# Patient Record
Sex: Female | Born: 2000
Health system: Southern US, Community
[De-identification: ages and names within clinical notes are randomized; demographics above are authoritative.]

---
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
## 2019-02-07 DIAGNOSIS — F4010 Social phobia, unspecified: Secondary | ICD-10-CM | POA: Diagnosis not present

## 2019-02-07 DIAGNOSIS — Z68.41 Body mass index (BMI) pediatric, 5th percentile to less than 85th percentile for age: Secondary | ICD-10-CM | POA: Diagnosis not present

## 2019-02-07 MED FILL — VENLAFAXINE HCL ER 75 MG CA: 75 | 30 days supply | Qty: 90 | Fill #0

## 2019-02-07 MED FILL — hydrOXYzine HCL 10 MG TABS: 10 | 30 days supply | Qty: 60 | Fill #0

## 2019-03-01 MED FILL — VENLAFAXINE HCL ER 75 MG CA: 75 | 30 days supply | Qty: 90 | Fill #1

## 2019-03-30 DIAGNOSIS — F341 Dysthymic disorder: Secondary | ICD-10-CM | POA: Diagnosis not present

## 2019-03-30 DIAGNOSIS — Z6823 Body mass index (BMI) 23.0-23.9, adult: Secondary | ICD-10-CM | POA: Diagnosis not present

## 2019-04-05 MED FILL — VENLAFAXINE HCL ER 75 MG CA: 75 | 30 days supply | Qty: 90 | Fill #0

## 2019-05-09 MED FILL — VENLAFAXINE HCL ER 75 MG CA: 75 | 30 days supply | Qty: 90 | Fill #1

## 2019-05-17 DIAGNOSIS — Z20822 Contact with and (suspected) exposure to covid-19: Secondary | ICD-10-CM | POA: Diagnosis not present

## 2019-05-24 DIAGNOSIS — F418 Other specified anxiety disorders: Secondary | ICD-10-CM | POA: Diagnosis not present

## 2019-05-24 DIAGNOSIS — R41840 Attention and concentration deficit: Secondary | ICD-10-CM | POA: Diagnosis not present

## 2019-05-24 DIAGNOSIS — Z6823 Body mass index (BMI) 23.0-23.9, adult: Secondary | ICD-10-CM | POA: Diagnosis not present

## 2019-06-22 DIAGNOSIS — F418 Other specified anxiety disorders: Secondary | ICD-10-CM | POA: Diagnosis not present

## 2019-06-22 DIAGNOSIS — Z6823 Body mass index (BMI) 23.0-23.9, adult: Secondary | ICD-10-CM | POA: Diagnosis not present

## 2019-06-27 MED FILL — VENLAFAXINE HCL ER 75 MG CA: 75 | 30 days supply | Qty: 90 | Fill #0

## 2019-08-10 MED FILL — VENLAFAXINE HCL ER 75 MG CA: 75 | 90 days supply | Qty: 270 | Fill #0

## 2019-11-16 DIAGNOSIS — F418 Other specified anxiety disorders: Secondary | ICD-10-CM | POA: Diagnosis not present

## 2019-11-16 DIAGNOSIS — Z68.41 Body mass index (BMI) pediatric, 5th percentile to less than 85th percentile for age: Secondary | ICD-10-CM | POA: Diagnosis not present

## 2019-12-12 DIAGNOSIS — F418 Other specified anxiety disorders: Secondary | ICD-10-CM | POA: Diagnosis not present

## 2019-12-20 DIAGNOSIS — Z6823 Body mass index (BMI) 23.0-23.9, adult: Secondary | ICD-10-CM | POA: Diagnosis not present

## 2019-12-20 DIAGNOSIS — U071 COVID-19: Secondary | ICD-10-CM | POA: Diagnosis not present

## 2020-02-21 DIAGNOSIS — Z23 Encounter for immunization: Secondary | ICD-10-CM | POA: Diagnosis not present

## 2020-03-10 DIAGNOSIS — Z79899 Other long term (current) drug therapy: Secondary | ICD-10-CM | POA: Diagnosis not present

## 2020-03-10 DIAGNOSIS — F419 Anxiety disorder, unspecified: Secondary | ICD-10-CM | POA: Diagnosis not present

## 2020-03-10 DIAGNOSIS — F331 Major depressive disorder, recurrent, moderate: Secondary | ICD-10-CM | POA: Diagnosis not present

## 2020-03-10 DIAGNOSIS — F902 Attention-deficit hyperactivity disorder, combined type: Secondary | ICD-10-CM | POA: Diagnosis not present

## 2020-03-10 DIAGNOSIS — Z6823 Body mass index (BMI) 23.0-23.9, adult: Secondary | ICD-10-CM | POA: Diagnosis not present

## 2020-03-13 ENCOUNTER — Other Ambulatory Visit (HOSPITAL_COMMUNITY): Payer: Self-pay

## 2020-03-13 MED FILL — AMPHETAMINE SALTS 10 MG: 10 | 30 days supply | Qty: 60 | Fill #0

## 2020-03-13 MED FILL — SERTRALINE HCL 50 MG TABLET: 50 | 30 days supply | Qty: 30 | Fill #0

## 2020-04-19 MED FILL — SERTRALINE HCL 50 MG TABLET: 50 | 30 days supply | Qty: 30 | Fill #1

## 2020-04-24 ENCOUNTER — Other Ambulatory Visit (HOSPITAL_COMMUNITY): Payer: Self-pay

## 2020-04-24 DIAGNOSIS — F331 Major depressive disorder, recurrent, moderate: Secondary | ICD-10-CM | POA: Diagnosis not present

## 2020-04-24 DIAGNOSIS — Z1331 Encounter for screening for depression: Secondary | ICD-10-CM | POA: Diagnosis not present

## 2020-04-24 DIAGNOSIS — Z6824 Body mass index (BMI) 24.0-24.9, adult: Secondary | ICD-10-CM | POA: Diagnosis not present

## 2020-04-24 DIAGNOSIS — F902 Attention-deficit hyperactivity disorder, combined type: Secondary | ICD-10-CM | POA: Diagnosis not present

## 2020-04-24 MED FILL — ADDERALL XR 30 MG CAP SA: 30 | 30 days supply | Qty: 30 | Fill #0

## 2020-05-04 DIAGNOSIS — Z20822 Contact with and (suspected) exposure to covid-19: Secondary | ICD-10-CM | POA: Diagnosis not present

## 2020-05-24 ENCOUNTER — Other Ambulatory Visit (HOSPITAL_COMMUNITY): Payer: Self-pay

## 2020-05-24 DIAGNOSIS — F331 Major depressive disorder, recurrent, moderate: Secondary | ICD-10-CM | POA: Diagnosis not present

## 2020-05-24 DIAGNOSIS — F902 Attention-deficit hyperactivity disorder, combined type: Secondary | ICD-10-CM | POA: Diagnosis not present

## 2020-05-24 DIAGNOSIS — F419 Anxiety disorder, unspecified: Secondary | ICD-10-CM | POA: Diagnosis not present

## 2020-05-24 MED FILL — ADDERALL XR 30 MG CAP SA: 30 | 30 days supply | Qty: 30 | Fill #0

## 2020-05-24 MED FILL — SERTRALINE HCL 50 MG TABS: 50 | 90 days supply | Qty: 90 | Fill #0

## 2020-07-04 DIAGNOSIS — Z20822 Contact with and (suspected) exposure to covid-19: Secondary | ICD-10-CM | POA: Diagnosis not present

## 2020-07-10 DIAGNOSIS — Z20822 Contact with and (suspected) exposure to covid-19: Secondary | ICD-10-CM | POA: Diagnosis not present

## 2020-07-18 ENCOUNTER — Other Ambulatory Visit (HOSPITAL_COMMUNITY): Payer: Self-pay

## 2020-07-18 DIAGNOSIS — F419 Anxiety disorder, unspecified: Secondary | ICD-10-CM | POA: Diagnosis not present

## 2020-07-18 DIAGNOSIS — F331 Major depressive disorder, recurrent, moderate: Secondary | ICD-10-CM | POA: Diagnosis not present

## 2020-07-18 DIAGNOSIS — M549 Dorsalgia, unspecified: Secondary | ICD-10-CM | POA: Diagnosis not present

## 2020-07-18 DIAGNOSIS — F902 Attention-deficit hyperactivity disorder, combined type: Secondary | ICD-10-CM | POA: Diagnosis not present

## 2020-07-26 ENCOUNTER — Other Ambulatory Visit (HOSPITAL_BASED_OUTPATIENT_CLINIC_OR_DEPARTMENT_OTHER): Payer: Self-pay

## 2020-09-03 ENCOUNTER — Other Ambulatory Visit (HOSPITAL_COMMUNITY): Payer: Self-pay

## 2020-09-03 DIAGNOSIS — F419 Anxiety disorder, unspecified: Secondary | ICD-10-CM | POA: Diagnosis not present

## 2020-09-03 DIAGNOSIS — F902 Attention-deficit hyperactivity disorder, combined type: Secondary | ICD-10-CM | POA: Diagnosis not present

## 2020-09-03 DIAGNOSIS — F32A Depression, unspecified: Secondary | ICD-10-CM | POA: Diagnosis not present

## 2020-09-03 MED ORDER — AMPHETAMINE-DEXTROAMPHETAMINE 10 MG PO TABS
10.0000 mg | ORAL_TABLET | Freq: Every day | ORAL | 0 refills | Status: DC
  Filled 2020-09-03: qty 30, 30d supply, fill #0

## 2020-09-03 MED ORDER — AMPHETAMINE-DEXTROAMPHET ER 30 MG PO CP24
30.0000 mg | ORAL_CAPSULE | Freq: Every morning | ORAL | 0 refills | Status: DC
  Filled 2020-09-03: qty 30, 30d supply, fill #0

## 2020-09-05 ENCOUNTER — Other Ambulatory Visit (HOSPITAL_COMMUNITY): Payer: Self-pay

## 2020-09-06 ENCOUNTER — Other Ambulatory Visit (HOSPITAL_COMMUNITY): Payer: Self-pay

## 2020-09-24 ENCOUNTER — Other Ambulatory Visit (HOSPITAL_COMMUNITY): Payer: Self-pay

## 2020-09-24 MED FILL — Sertraline HCl Tab 50 MG: ORAL | 90 days supply | Qty: 90 | Fill #0 | Status: CN

## 2020-09-28 ENCOUNTER — Other Ambulatory Visit (HOSPITAL_COMMUNITY): Payer: Self-pay

## 2020-09-28 ENCOUNTER — Other Ambulatory Visit (HOSPITAL_BASED_OUTPATIENT_CLINIC_OR_DEPARTMENT_OTHER): Payer: Self-pay

## 2020-09-28 MED FILL — Sertraline HCl Tab 50 MG: ORAL | 90 days supply | Qty: 90 | Fill #0 | Status: AC

## 2020-10-03 ENCOUNTER — Other Ambulatory Visit (HOSPITAL_COMMUNITY): Payer: Self-pay

## 2020-10-19 ENCOUNTER — Other Ambulatory Visit (HOSPITAL_COMMUNITY): Payer: Self-pay

## 2020-10-19 MED ORDER — AMPHETAMINE-DEXTROAMPHET ER 30 MG PO CP24
ORAL_CAPSULE | ORAL | 0 refills | Status: AC
  Filled 2020-10-19: qty 30, 30d supply, fill #0

## 2020-10-21 ENCOUNTER — Observation Stay (HOSPITAL_COMMUNITY)
Admission: EM | Admit: 2020-10-21 | Discharge: 2020-10-22 | Disposition: A | Discharge status: Home / Self Care | Payer: 59 | Attending: General Surgery | Admitting: General Surgery

## 2020-10-21 ENCOUNTER — Other Ambulatory Visit: Payer: Self-pay

## 2020-10-21 ENCOUNTER — Encounter (HOSPITAL_COMMUNITY): Payer: Self-pay | Admitting: *Deleted

## 2020-10-21 DIAGNOSIS — R7401 Elevation of levels of liver transaminase levels: Secondary | ICD-10-CM | POA: Diagnosis present

## 2020-10-21 DIAGNOSIS — R1011 Right upper quadrant pain: Secondary | ICD-10-CM | POA: Diagnosis not present

## 2020-10-21 DIAGNOSIS — M549 Dorsalgia, unspecified: Secondary | ICD-10-CM | POA: Diagnosis not present

## 2020-10-21 DIAGNOSIS — R52 Pain, unspecified: Secondary | ICD-10-CM

## 2020-10-21 DIAGNOSIS — R8290 Unspecified abnormal findings in urine: Secondary | ICD-10-CM | POA: Diagnosis not present

## 2020-10-21 DIAGNOSIS — R112 Nausea with vomiting, unspecified: Secondary | ICD-10-CM

## 2020-10-21 DIAGNOSIS — K8010 Calculus of gallbladder with chronic cholecystitis without obstruction: Secondary | ICD-10-CM | POA: Diagnosis not present

## 2020-10-21 DIAGNOSIS — Z20822 Contact with and (suspected) exposure to covid-19: Secondary | ICD-10-CM | POA: Insufficient documentation

## 2020-10-21 DIAGNOSIS — R9431 Abnormal electrocardiogram [ECG] [EKG]: Secondary | ICD-10-CM | POA: Diagnosis not present

## 2020-10-21 DIAGNOSIS — K8020 Calculus of gallbladder without cholecystitis without obstruction: Secondary | ICD-10-CM | POA: Diagnosis not present

## 2020-10-21 DIAGNOSIS — R1013 Epigastric pain: Secondary | ICD-10-CM | POA: Diagnosis not present

## 2020-10-21 LAB — COMPREHENSIVE METABOLIC PANEL
ALT: 46 U/L — ABNORMAL HIGH (ref 0–44)
AST: 77 U/L — ABNORMAL HIGH (ref 15–41)
Albumin: 5.2 g/dL — ABNORMAL HIGH (ref 3.5–5.0)
Alkaline Phosphatase: 71 U/L (ref 38–126)
Anion gap: 13 (ref 5–15)
BUN: 11 mg/dL (ref 6–20)
CO2: 23 mmol/L (ref 22–32)
Calcium: 10.1 mg/dL (ref 8.9–10.3)
Chloride: 100 mmol/L (ref 98–111)
Creatinine, Ser: 0.64 mg/dL (ref 0.44–1.00)
GFR, Estimated: >60 mL/min (ref >60)
Glucose, Bld: 102 mg/dL — ABNORMAL HIGH (ref 70–99)
Potassium: 3.9 mmol/L (ref 3.5–5.1)
Sodium: 136 mmol/L (ref 135–145)
Total Bilirubin: 1.8 mg/dL — ABNORMAL HIGH (ref 0.3–1.2)
Total Protein: 9 g/dL — ABNORMAL HIGH (ref 6.5–8.1)

## 2020-10-21 LAB — CBC
HCT: 44.6 % (ref 36.0–46.0)
Hemoglobin: 14.5 g/dL (ref 12.0–15.0)
MCH: 27.5 pg (ref 26.0–34.0)
MCHC: 32.5 g/dL (ref 30.0–36.0)
MCV: 84.5 fL (ref 80.0–100.0)
Platelets: 253 10*3/uL (ref 150–400)
RBC: 5.28 MIL/uL — ABNORMAL HIGH (ref 3.87–5.11)
RDW: 12.9 % (ref 11.5–15.5)
WBC: 9.6 10*3/uL (ref 4.0–10.5)
nRBC: 0 % (ref 0.0–0.2)

## 2020-10-21 LAB — RESP PANEL BY RT-PCR (FLU A&B, COVID) ARPGX2
Influenza A by PCR: NEGATIVE
Influenza B by PCR: NEGATIVE
SARS Coronavirus 2 by RT PCR: NEGATIVE

## 2020-10-21 LAB — HCG, QUANTITATIVE, PREGNANCY: hCG, Beta Chain, Quant, S: <1 m[IU]/mL (ref <5)

## 2020-10-21 LAB — LIPASE, BLOOD: Lipase: 28 U/L (ref 11–51)

## 2020-10-21 MED ORDER — HYDROMORPHONE HCL 1 MG/ML IJ SOLN
1.0000 mg | INTRAMUSCULAR | Status: DC | PRN
  Administered 2020-10-21: 1 mg via INTRAVENOUS
  Filled 2020-10-21: qty 1

## 2020-10-21 MED ORDER — SODIUM CHLORIDE 0.9 % IV SOLN
12.5000 mg | Freq: Four times a day (QID) | INTRAVENOUS | Status: DC | PRN
  Administered 2020-10-22: 12.5 mg via INTRAVENOUS
  Filled 2020-10-21: qty 0.5

## 2020-10-21 MED ORDER — DIPHENHYDRAMINE HCL 50 MG/ML IJ SOLN: 25.0000 mg | Freq: Four times a day (QID) | INTRAMUSCULAR | Status: DC | PRN

## 2020-10-21 MED ORDER — LORAZEPAM 2 MG/ML IJ SOLN: 1.0000 mg | INTRAMUSCULAR | Status: DC | PRN

## 2020-10-21 MED ORDER — SODIUM CHLORIDE 0.9 % IV SOLN
2.0000 g | INTRAVENOUS | Status: DC
  Administered 2020-10-21: 2 g via INTRAVENOUS
  Filled 2020-10-21: qty 20

## 2020-10-21 MED ORDER — ONDANSETRON HCL 4 MG/2ML IJ SOLN
4.0000 mg | Freq: Once | INTRAMUSCULAR | Status: AC
  Administered 2020-10-21: 4 mg via INTRAVENOUS
  Filled 2020-10-21: qty 2

## 2020-10-21 MED ORDER — DIPHENHYDRAMINE HCL 25 MG PO CAPS: 25.0000 mg | ORAL_CAPSULE | Freq: Four times a day (QID) | ORAL | Status: DC | PRN

## 2020-10-21 MED ORDER — ONDANSETRON 4 MG PO TBDP: 4.0000 mg | ORAL_TABLET | Freq: Four times a day (QID) | ORAL | Status: DC | PRN

## 2020-10-21 MED ORDER — SODIUM CHLORIDE 0.9 % IV BOLUS
1000.0000 mL | Freq: Once | INTRAVENOUS | Status: AC
  Administered 2020-10-21: 1000 mL via INTRAVENOUS

## 2020-10-21 MED ORDER — ACETAMINOPHEN 650 MG RE SUPP: 650.0000 mg | Freq: Four times a day (QID) | RECTAL | Status: DC | PRN

## 2020-10-21 MED ORDER — ACETAMINOPHEN 325 MG PO TABS: 650.0000 mg | ORAL_TABLET | Freq: Four times a day (QID) | ORAL | Status: DC | PRN

## 2020-10-21 MED ORDER — ONDANSETRON HCL 4 MG/2ML IJ SOLN: 4.0000 mg | Freq: Four times a day (QID) | INTRAMUSCULAR | Status: DC | PRN

## 2020-10-21 MED ORDER — LACTATED RINGERS IV SOLN: INTRAVENOUS | Status: DC

## 2020-10-21 NOTE — ED Notes (Signed)
Urine at bedside. 

## 2020-10-21 NOTE — ED Provider Notes (Signed)
[facility] EMERGENCY DEPARTMENT Provider Note   CSN: [id number] Arrival date & time: 10/21/20  1831     History Chief Complaint  Patient presents with   Abdominal Pain    Lorraine Thomas is a 20 y.o. female.  HPI  Patient with no significant medical history presents to the emergency department with chief complaint of right upper quadrant pain.  Patient states pain has been going on for last 4 weeks but over the last 2 days pain has progressed and got worse.  She endorses a throbbing-like sensation in her right upper quadrant which then radiates into her back between her scapulas.  She denies associated nausea, vomiting, denies hematemesis or coffee-ground hematemesis, constipation, diarrhea, and urinary symptoms.  She states that the pain comes and goes, she does not necessarily notice it with food intake,but over the last 2 days she has been unable to tolerate p.o.  She denies fevers, chills, congestion, sore throat or cough, denies general body aches, denies recent sick contacts.  She has no significant abdominal history.  Patient denies any alleviating factors.  History reviewed. No pertinent past medical history.  Patient Active Problem List   Diagnosis Date Noted   Transaminitis 10/21/2020    History reviewed. No pertinent surgical history.   OB History   No obstetric history on file.     No family history on file.  Social History   Tobacco Use   Smoking status: Never  Substance Use Topics   Alcohol use: Never   Drug use: Never    Home Medications Prior to Admission medications   Medication Sig Start Date End Date Taking? Authorizing Provider  amphetamine-dextroamphetamine (ADDERALL XR) 30 MG 24 hr capsule TAKE 1 CAPSULE BY MOUTH EVERY MORNING (FILL 09/14/20) 07/18/20 01/14/21    amphetamine-dextroamphetamine (ADDERALL XR) 30 MG 24 hr capsule TAKE 1 CAPSULE BY MOUTH EVERY MORNING (FILL 08/17/20) 07/18/20 01/14/21    amphetamine-dextroamphetamine (ADDERALL XR) 30 MG 24  hr capsule TAKE 1 CAPSULE BY MOUTH EVERY MORNING 07/18/20 01/14/21    amphetamine-dextroamphetamine (ADDERALL XR) 30 MG 24 hr capsule TAKE 1 CAPSULE BY MOUTH IN THE MORNING - EFFECTIVE 07/20/20 05/24/20 11/20/20    amphetamine-dextroamphetamine (ADDERALL XR) 30 MG 24 hr capsule TAKE 1 CAPSULE BY MOUTH EVERY MORNING 05/24/20 11/20/20    amphetamine-dextroamphetamine (ADDERALL XR) 30 MG 24 hr capsule TAKE 1 CAPSULE BY MOUTH DAILY IN THE MORNING - EFFECTIVE 06/23/20 05/24/20 11/20/20    amphetamine-dextroamphetamine (ADDERALL XR) 30 MG 24 hr capsule TAKE 1 CAPSULE BY MOUTH ONCE A DAY IN THE MORNING 04/24/20 10/21/20    amphetamine-dextroamphetamine (ADDERALL XR) 30 MG 24 hr capsule Take 1 capsule (30 mg total) by mouth every morning. 09/03/20     amphetamine-dextroamphetamine (ADDERALL XR) 30 MG 24 hr capsule Take 1 capsule by mouth once a day (in the morning) 10/19/20     amphetamine-dextroamphetamine (ADDERALL) 10 MG tablet TAKE 1 TABLET BY MOUTH EVERY MORNING AND AT LUNCH AS DIRECTED 03/13/20 09/09/20    amphetamine-dextroamphetamine (ADDERALL) 10 MG tablet Take 1 tablet (10 mg total) by mouth daily if needed. 09/03/20     hydrOXYzine (ATARAX/VISTARIL) 10 MG tablet TAKE 1 TABLET BY MOUTH 4 TIMES DAILY AS NEEDED 07/18/20 07/18/21    sertraline (ZOLOFT) 50 MG tablet Take 1 tablet (50 mg total) by mouth daily. 07/18/20     sertraline (ZOLOFT) 50 MG tablet TAKE 1 TABLET BY MOUTH DAILY 05/24/20 05/24/21    sertraline (ZOLOFT) 50 MG tablet TAKE 1 TABLET BY MOUTH ONCE A DAY  03/13/20 03/13/21      Allergies    Patient has no known allergies.  Review of Systems   Review of Systems  Constitutional:  Negative for chills and fever.  HENT:  Negative for congestion.   Respiratory:  Negative for shortness of breath.   Cardiovascular:  Negative for chest pain.  Gastrointestinal:  Positive for abdominal pain, nausea and vomiting.  Genitourinary:  Negative for enuresis.  Musculoskeletal:  Positive for back pain.  Skin:  Negative for  rash.  Neurological:  Negative for dizziness and headaches.  Hematological:  Does not bruise/bleed easily.   Physical Exam Updated Vital Signs BP 109/71   Pulse 90   Temp 97.8 F (36.6 C)   Resp (!) 31   SpO2 100%   Physical Exam Vitals and nursing note reviewed.  Constitutional:      General: She is not in acute distress.    Appearance: She is not ill-appearing.  HENT:     Head: Normocephalic and atraumatic.     Nose: No congestion.  Eyes:     Conjunctiva/sclera: Conjunctivae normal.  Cardiovascular:     Rate and Rhythm: Normal rate and regular rhythm.     Pulses: Normal pulses.     Heart sounds: No murmur heard.   No friction rub. No gallop.  Pulmonary:     Effort: No respiratory distress.     Breath sounds: No wheezing, rhonchi or rales.  Abdominal:     Palpations: Abdomen is soft.     Tenderness: There is no abdominal tenderness. There is no right CVA tenderness or left CVA tenderness.  Musculoskeletal:     Right lower leg: No edema.     Left lower leg: No edema.  Skin:    General: Skin is warm and dry.  Neurological:     Mental Status: She is alert.  Psychiatric:        Mood and Affect: Mood normal.    ED Results / Procedures / Treatments   Labs (all labs ordered are listed, but only abnormal results are displayed) Labs Reviewed  COMPREHENSIVE METABOLIC PANEL - Abnormal; Notable for the following components:      Result Value   Glucose, Bld 102 (*)    Total Protein 9.0 (*)    Albumin 5.2 (*)    AST 77 (*)    ALT 46 (*)    Total Bilirubin 1.8 (*)    All other components within normal limits  CBC - Abnormal; Notable for the following components:   RBC 5.28 (*)    All other components within normal limits  RESP PANEL BY RT-PCR (FLU A&B, COVID) ARPGX2  LIPASE, BLOOD  HCG, QUANTITATIVE, PREGNANCY  COMPREHENSIVE METABOLIC PANEL  CBC  HIV ANTIBODY (ROUTINE TESTING W REFLEX)    EKG EKG Interpretation  Date/Time:  Sunday October 21 2020 19:27:07  EDT Ventricular Rate:  99 PR Interval:  140 QRS Duration: 80 QT Interval:  363 QTC Calculation: 466 R Axis:   89 Text Interpretation: Sinus rhythm Confirmed by Mancel Bale [phone]) on 10/21/2020 9:19:28 PM  Radiology No results found.  Procedures Procedures   Medications Ordered in ED Medications  lactated ringers infusion ( Intravenous New Bag/Given 10/21/20 2311)  cefTRIAXone (ROCEPHIN) 2 g in sodium chloride 0.9 % 100 mL IVPB (2 g Intravenous New Bag/Given 10/21/20 2311)  acetaminophen (TYLENOL) tablet 650 mg (has no administration in time range)    Or  acetaminophen (TYLENOL) suppository 650 mg (has no administration in time range)  HYDROmorphone (  DILAUDID) injection 1 mg (1 mg Intravenous Given 10/21/20 2313)  diphenhydrAMINE (BENADRYL) capsule 25 mg (has no administration in time range)    Or  diphenhydrAMINE (BENADRYL) injection 25 mg (has no administration in time range)  promethazine (PHENERGAN) 12.5 mg in sodium chloride 0.9 % 50 mL IVPB (has no administration in time range)  ondansetron (ZOFRAN-ODT) disintegrating tablet 4 mg (has no administration in time range)    Or  ondansetron (ZOFRAN) injection 4 mg (has no administration in time range)  LORazepam (ATIVAN) injection 1 mg (has no administration in time range)  sodium chloride 0.9 % bolus 1,000 mL (1,000 mLs Intravenous New Bag/Given 10/21/20 2135)  ondansetron (ZOFRAN) injection 4 mg (4 mg Intravenous Given 10/21/20 2137)    ED Course  I have reviewed the triage vital signs and the nursing notes.  Pertinent labs & imaging results that were available during my care of the patient were reviewed by me and considered in my medical decision making (see chart for details).    MDM Rules/Calculators/A&P                         Initial impression-patient presents with right upper quadrant pain since resolved.  She is alert, does not appear  to be in acute stress, vital signs reassuring.  Will obtain basic lab work-up,  provide patient fluids, antiemetics and reassess.  Work-up-CBC unremarkable, CMP shows hyperglycemia 102, elevated AST 77, elevated ALT 46, elevated T bili 1.8.  Lipase 28, respiratory panel negative  Reassessment-patient  reassessed, continues to have no complaints this time, I am concerned for possible gallbladder abnormality as she has elevated liver enzymes as well as T bili.  Will consult with general surgery for further evaluation.  Consult-spoke with Dr. Lovell Sheehan he will admit the patient for further work-up  Rule out-low suspicion for systemic infection as patient is nontoxic-appearing, vital signs reassuring, no leukocytosis present.  Low suspicion for ruptured stomach ulcer as abdomen is nondistended, dull to percussion, no peritoneal sign present. low Suspicion for pancreatitis as lipase within normal limits, she has no epigastric pain.  Low suspicion for bowel obstruction as abdomen is nondistended, dull to percussion, no peritoneal sign present my exam.  Low suspicion for UTI as patient denies urinary symptoms, etiology inconsistent with presentation.  Plan-admit to general surgery for possible gallbladder attack  Final Clinical Impression(s) / ED Diagnoses Final diagnoses:  Transaminitis  Right upper quadrant abdominal pain  Non-intractable vomiting with nausea, unspecified vomiting type    Rx / DC Orders ED Discharge Orders     None        Carroll Sage, PA-C 10/22/20 0026    Mancel Bale, MD 10/22/20 1159

## 2020-10-21 NOTE — H&P (Signed)
Lorraine Thomas is an 20 y.o. female.   Chief Complaint: Right upper quadrant abdominal pain, nausea HPI: Patient is a 20 year old white female who was in her usual state of health when she presented to the emergency room this evening with worsening right upper quadrant abdominal pain, nausea.  The pain extended straight to her back.  This happened after eating.  She states she has had episodes of this in the past, but they have resolved on their own.  She denies any fever, chills, jaundice.  She was brought into the emergency room and her LFTs are noted to be elevated.  Her pain has subsided without pain medication.  She has received something for nausea.  History reviewed. No pertinent past medical history.  History reviewed. No pertinent surgical history.  No family history on file. Social History:  reports that she does not drink alcohol and does not use drugs. No history on file for tobacco use.  Allergies: No Known Allergies  (Not in a hospital admission)   Results for orders placed or performed during the hospital encounter of 10/21/20 (from the past 48 hour(s))  Lipase, blood     Status: None   Collection Time: 10/21/20  7:55 PM  Result Value Ref Range   Lipase 28 11 - 51 U/L    Comment: Performed at [facility], [address] [place], [place] [address]  Comprehensive metabolic panel     Status: Abnormal   Collection Time: 10/21/20  7:55 PM  Result Value Ref Range   Sodium 136 135 - 145 mmol/L   Potassium 3.9 3.5 - 5.1 mmol/L   Chloride 100 98 - 111 mmol/L   CO2 23 22 - 32 mmol/L   Glucose, Bld 102 (H) 70 - 99 mg/dL    Comment: Glucose reference range applies only to samples taken after fasting for at least 8 hours.   BUN 11 6 - 20 mg/dL   Creatinine, Ser 0.27 0.44 - 1.00 mg/dL   Calcium 25.3 8.9 - 66.4 mg/dL   Total Protein 9.0 (H) 6.5 - 8.1 g/dL   Albumin 5.2 (H) 3.5 - 5.0 g/dL   AST 77 (H) 15 - 41 U/L   ALT 46 (H) 0 - 44 U/L   Alkaline Phosphatase 71 38 - 126 U/L    Total Bilirubin 1.8 (H) 0.3 - 1.2 mg/dL   GFR, Estimated >40 >34 mL/min    Comment: (NOTE) Calculated using the CKD-EPI Creatinine Equation (2021)    Anion gap 13 5 - 15    Comment: Performed at [facility], [address] [place], [place] [address]  CBC     Status: Abnormal   Collection Time: 10/21/20  7:55 PM  Result Value Ref Range   WBC 9.6 4.0 - 10.5 K/uL   RBC 5.28 (H) 3.87 - 5.11 MIL/uL   Hemoglobin 14.5 12.0 - 15.0 g/dL   HCT 56.3 87.5 - 64.3 %   MCV 84.5 80.0 - 100.0 fL   MCH 27.5 26.0 - 34.0 pg   MCHC 32.5 30.0 - 36.0 g/dL   RDW 32.9 51.8 - 84.1 %   Platelets 253 150 - 400 K/uL   nRBC 0.0 0.0 - 0.2 %    Comment: Performed at [facility], [address], [place], [place] [address]  hCG, quantitative, pregnancy     Status: None   Collection Time: 10/21/20  7:55 PM  Result Value Ref Range   hCG, Beta Chain, Quant, S <1 <5 mIU/mL  Comment:          GEST. AGE      CONC.  (mIU/mL)   <=1 WEEK        5 - 50     2 WEEKS       50 - 500     3 WEEKS       100 - 10,000     4 WEEKS     1,000 - 30,000     5 WEEKS     3,500 - 115,000   6-8 WEEKS     12,000 - 270,000    12 WEEKS     15,000 - 220,000        FEMALE AND NON-PREGNANT FEMALE:     LESS THAN 5 mIU/mL Performed at [facility], [address], [place], [place] [address]   Resp Panel by RT-PCR (Flu A&B, Covid) Nasopharyngeal Swab     Status: None   Collection Time: 10/21/20  7:57 PM   Specimen: Nasopharyngeal Swab; Nasopharyngeal(NP) swabs in vial transport medium  Result Value Ref Range   SARS Coronavirus 2 by RT PCR NEGATIVE NEGATIVE    Comment: (NOTE) SARS-CoV-2 target nucleic acids are NOT DETECTED.  The SARS-CoV-2 RNA is generally detectable in upper respiratory specimens during the acute phase of infection. The lowest concentration of SARS-CoV-2 viral copies this assay can detect is 138 copies/mL. A negative result does not preclude SARS-Cov-2 infection and should not be used as the sole basis for treatment  or other patient management decisions. A negative result may occur with  improper specimen collection/handling, submission of specimen other than nasopharyngeal swab, presence of viral mutation(s) within the areas targeted by this assay, and inadequate number of viral copies(<138 copies/mL). A negative result must be combined with clinical observations, patient history, and epidemiological information. The expected result is Negative.  Fact Sheet for Patients:  BloggerCourse.com  Fact Sheet for Healthcare Providers:  SeriousBroker.it  This test is no t yet approved or cleared by the [place] FDA and  has been authorized for detection and/or diagnosis of SARS-CoV-2 by FDA under an Emergency Use Authorization (EUA). This EUA will remain  in effect (meaning this test can be used) for the duration of the COVID-19 declaration under Section 564(b)(1) of the Act, 21 U.S.C.section 360bbb-3(b)(1), unless the authorization is terminated  or revoked sooner.       Influenza A by PCR NEGATIVE NEGATIVE   Influenza B by PCR NEGATIVE NEGATIVE    Comment: (NOTE) The Xpert Xpress SARS-CoV-2/FLU/RSV plus assay is intended as an aid in the diagnosis of influenza from Nasopharyngeal swab specimens and should not be used as a sole basis for treatment. Nasal washings and aspirates are unacceptable for Xpert Xpress SARS-CoV-2/FLU/RSV testing.  Fact Sheet for Patients: BloggerCourse.com  Fact Sheet for Healthcare Providers: SeriousBroker.it  This test is not yet approved or cleared by the [place] FDA and has been authorized for detection and/or diagnosis of SARS-CoV-2 by FDA under an Emergency Use Authorization (EUA). This EUA will remain in effect (meaning this test can be used) for the duration of the COVID-19 declaration under Section 564(b)(1) of the Act, 21 U.S.C. section  360bbb-3(b)(1), unless the authorization is terminated or revoked.  Performed at [facility], [address] [place], [place] [address]    No results found.  Review of Systems  Constitutional: Negative.   HENT: Negative.    Eyes: Negative.   Respiratory: Negative.    Cardiovascular: Negative.   Gastrointestinal:  Positive  for abdominal pain and nausea.  Endocrine: Negative.   Genitourinary: Negative.   Musculoskeletal: Negative.   Skin: Negative.   Allergic/Immunologic: Negative.   Neurological: Negative.   Hematological: Negative.   Psychiatric/Behavioral: Negative.     Blood pressure 109/71, pulse 94, temperature 97.8 F (36.6 C), resp. rate (!) 22, SpO2 100 %. Physical Exam Vitals reviewed.  Constitutional:      Appearance: She is well-developed. She is not ill-appearing.  HENT:     Head: Normocephalic and atraumatic.  Eyes:     General: No scleral icterus. Cardiovascular:     Rate and Rhythm: Normal rate and regular rhythm.     Heart sounds: Normal heart sounds. No murmur heard.   No friction rub. No gallop.  Pulmonary:     Effort: Pulmonary effort is normal. No respiratory distress.     Breath sounds: Normal breath sounds. No stridor. No wheezing, rhonchi or rales.  Abdominal:     General: Abdomen is flat. Bowel sounds are normal. There is no distension.     Palpations: Abdomen is soft. There is no hepatomegaly or mass.     Tenderness: There is no abdominal tenderness. There is no guarding or rebound. Negative signs include Murphy's sign.     Hernia: No hernia is present.  Skin:    General: Skin is warm and dry.  Neurological:     Mental Status: She is alert and oriented to person, place, and time.    Labs reviewed Assessment/Plan Impression: Biliary colic secondary to cholelithiasis.  She does have transaminitis. Plan: We will bring the patient into the hospital to control her nausea and pain.  We will get ultrasound of the right upper quadrant in the  morning.  Further management is pending those results.  Franky Macho, MD 10/21/2020, 9:40 PM

## 2020-10-21 NOTE — ED Triage Notes (Signed)
Abdominal pain with vomiting 

## 2020-10-22 ENCOUNTER — Observation Stay (HOSPITAL_COMMUNITY): Payer: 59

## 2020-10-22 ENCOUNTER — Observation Stay (HOSPITAL_COMMUNITY): Payer: 59 | Admitting: Certified Registered"

## 2020-10-22 ENCOUNTER — Encounter (HOSPITAL_COMMUNITY): Payer: Self-pay | Admitting: General Surgery

## 2020-10-22 ENCOUNTER — Encounter (HOSPITAL_COMMUNITY)
Admission: EM | Disposition: A | Discharge status: Home / Self Care | Payer: Self-pay | Source: Home / Self Care | Attending: Emergency Medicine

## 2020-10-22 DIAGNOSIS — R7401 Elevation of levels of liver transaminase levels: Secondary | ICD-10-CM | POA: Diagnosis not present

## 2020-10-22 DIAGNOSIS — K8020 Calculus of gallbladder without cholecystitis without obstruction: Secondary | ICD-10-CM

## 2020-10-22 DIAGNOSIS — R109 Unspecified abdominal pain: Secondary | ICD-10-CM | POA: Diagnosis not present

## 2020-10-22 DIAGNOSIS — Z20822 Contact with and (suspected) exposure to covid-19: Secondary | ICD-10-CM | POA: Diagnosis not present

## 2020-10-22 DIAGNOSIS — K8010 Calculus of gallbladder with chronic cholecystitis without obstruction: Secondary | ICD-10-CM | POA: Diagnosis not present

## 2020-10-22 DIAGNOSIS — K760 Fatty (change of) liver, not elsewhere classified: Secondary | ICD-10-CM | POA: Diagnosis not present

## 2020-10-22 HISTORY — PX: CHOLECYSTECTOMY: SHX55

## 2020-10-22 LAB — CBC
HCT: 34.4 % — ABNORMAL LOW (ref 36.0–46.0)
Hemoglobin: 11.1 g/dL — ABNORMAL LOW (ref 12.0–15.0)
MCH: 27.8 pg (ref 26.0–34.0)
MCHC: 32.3 g/dL (ref 30.0–36.0)
MCV: 86.2 fL (ref 80.0–100.0)
Platelets: 223 10*3/uL (ref 150–400)
RBC: 3.99 MIL/uL (ref 3.87–5.11)
RDW: 12.9 % (ref 11.5–15.5)
WBC: 7.7 10*3/uL (ref 4.0–10.5)
nRBC: 0 % (ref 0.0–0.2)

## 2020-10-22 LAB — HIV ANTIBODY (ROUTINE TESTING W REFLEX): HIV Screen 4th Generation wRfx: NONREACTIVE

## 2020-10-22 LAB — COMPREHENSIVE METABOLIC PANEL
ALT: 33 U/L (ref 0–44)
AST: 35 U/L (ref 15–41)
Albumin: 3.4 g/dL — ABNORMAL LOW (ref 3.5–5.0)
Alkaline Phosphatase: 49 U/L (ref 38–126)
Anion gap: 7 (ref 5–15)
BUN: 9 mg/dL (ref 6–20)
CO2: 24 mmol/L (ref 22–32)
Calcium: 9 mg/dL (ref 8.9–10.3)
Chloride: 106 mmol/L (ref 98–111)
Creatinine, Ser: 0.43 mg/dL — ABNORMAL LOW (ref 0.44–1.00)
GFR, Estimated: >60 mL/min (ref >60)
Glucose, Bld: 86 mg/dL (ref 70–99)
Potassium: 3.8 mmol/L (ref 3.5–5.1)
Sodium: 137 mmol/L (ref 135–145)
Total Bilirubin: 0.7 mg/dL (ref 0.3–1.2)
Total Protein: 6.2 g/dL — ABNORMAL LOW (ref 6.5–8.1)

## 2020-10-22 SURGERY — LAPAROSCOPIC CHOLECYSTECTOMY
Anesthesia: General | Site: Abdomen

## 2020-10-22 MED ORDER — MIDAZOLAM HCL 2 MG/2ML IJ SOLN
INTRAMUSCULAR | Status: AC
  Filled 2020-10-22: qty 2

## 2020-10-22 MED ORDER — SCOPOLAMINE 1 MG/3DAYS TD PT72: 1.0000 | MEDICATED_PATCH | Freq: Once | TRANSDERMAL | Status: DC

## 2020-10-22 MED ORDER — FENTANYL CITRATE (PF) 100 MCG/2ML IJ SOLN
INTRAMUSCULAR | Status: DC | PRN
  Administered 2020-10-22: 50 ug via INTRAVENOUS
  Administered 2020-10-22: 100 ug via INTRAVENOUS
  Administered 2020-10-22: 50 ug via INTRAVENOUS

## 2020-10-22 MED ORDER — CHLORHEXIDINE GLUCONATE 0.12 % MT SOLN
15.0000 mL | Freq: Once | OROMUCOSAL | Status: AC
  Administered 2020-10-22: 15 mL via OROMUCOSAL

## 2020-10-22 MED ORDER — PROPOFOL 10 MG/ML IV BOLUS
INTRAVENOUS | Status: AC
  Filled 2020-10-22: qty 20

## 2020-10-22 MED ORDER — HEMOSTATIC AGENTS (NO CHARGE) OPTIME
TOPICAL | Status: DC | PRN
  Administered 2020-10-22: 1 via TOPICAL

## 2020-10-22 MED ORDER — HYDROMORPHONE HCL 1 MG/ML IJ SOLN: 0.2500 mg | INTRAMUSCULAR | Status: DC | PRN

## 2020-10-22 MED ORDER — MIDAZOLAM HCL 5 MG/5ML IJ SOLN
INTRAMUSCULAR | Status: DC | PRN
  Administered 2020-10-22: 2 mg via INTRAVENOUS

## 2020-10-22 MED ORDER — CHLORHEXIDINE GLUCONATE CLOTH 2 % EX PADS: 6.0000 | MEDICATED_PAD | Freq: Once | CUTANEOUS | Status: DC

## 2020-10-22 MED ORDER — PROPOFOL 10 MG/ML IV BOLUS
INTRAVENOUS | Status: DC | PRN
  Administered 2020-10-22: 150 mg via INTRAVENOUS

## 2020-10-22 MED ORDER — LIDOCAINE 2% (20 MG/ML) 5 ML SYRINGE
INTRAMUSCULAR | Status: DC | PRN
  Administered 2020-10-22: 100 mg via INTRAVENOUS

## 2020-10-22 MED ORDER — SUCCINYLCHOLINE CHLORIDE 200 MG/10ML IV SOSY
PREFILLED_SYRINGE | INTRAVENOUS | Status: DC | PRN
  Administered 2020-10-22: 100 mg via INTRAVENOUS

## 2020-10-22 MED ORDER — ONDANSETRON HCL 4 MG/2ML IJ SOLN
INTRAMUSCULAR | Status: DC | PRN
  Administered 2020-10-22: 4 mg via INTRAVENOUS

## 2020-10-22 MED ORDER — KETOROLAC TROMETHAMINE 30 MG/ML IJ SOLN
INTRAMUSCULAR | Status: AC
  Filled 2020-10-22: qty 1

## 2020-10-22 MED ORDER — BUPIVACAINE LIPOSOME 1.3 % IJ SUSP
INTRAMUSCULAR | Status: DC | PRN
  Administered 2020-10-22: 20 mL

## 2020-10-22 MED ORDER — SUGAMMADEX SODIUM 200 MG/2ML IV SOLN
INTRAVENOUS | Status: DC | PRN
  Administered 2020-10-22: 200 mg via INTRAVENOUS

## 2020-10-22 MED ORDER — ONDANSETRON HCL 4 MG/2ML IJ SOLN
INTRAMUSCULAR | Status: AC
  Filled 2020-10-22: qty 2

## 2020-10-22 MED ORDER — DEXAMETHASONE SODIUM PHOSPHATE 10 MG/ML IJ SOLN
INTRAMUSCULAR | Status: AC
  Filled 2020-10-22: qty 1

## 2020-10-22 MED ORDER — DEXAMETHASONE SODIUM PHOSPHATE 10 MG/ML IJ SOLN
INTRAMUSCULAR | Status: DC | PRN
  Administered 2020-10-22: 5 mg via INTRAVENOUS

## 2020-10-22 MED ORDER — LIDOCAINE HCL (PF) 2 % IJ SOLN
INTRAMUSCULAR | Status: AC
  Filled 2020-10-22: qty 5

## 2020-10-22 MED ORDER — PROMETHAZINE HCL 25 MG/ML IJ SOLN
INTRAMUSCULAR | Status: AC
  Filled 2020-10-22: qty 1

## 2020-10-22 MED ORDER — KETOROLAC TROMETHAMINE 30 MG/ML IJ SOLN
INTRAMUSCULAR | Status: DC | PRN
  Administered 2020-10-22: 30 mg via INTRAVENOUS

## 2020-10-22 MED ORDER — SCOPOLAMINE 1 MG/3DAYS TD PT72
MEDICATED_PATCH | TRANSDERMAL | Status: AC
  Administered 2020-10-22: 1.5 mg via TRANSDERMAL
  Filled 2020-10-22: qty 1

## 2020-10-22 MED ORDER — ROCURONIUM BROMIDE 10 MG/ML (PF) SYRINGE
PREFILLED_SYRINGE | INTRAVENOUS | Status: DC | PRN
  Administered 2020-10-22: 30 mg via INTRAVENOUS

## 2020-10-22 MED ORDER — ORAL CARE MOUTH RINSE: 15.0000 mL | Freq: Once | OROMUCOSAL | Status: AC

## 2020-10-22 MED ORDER — DEXMEDETOMIDINE (PRECEDEX) IN NS 20 MCG/5ML (4 MCG/ML) IV SYRINGE
PREFILLED_SYRINGE | INTRAVENOUS | Status: DC | PRN
  Administered 2020-10-22: 8 ug via INTRAVENOUS

## 2020-10-22 MED ORDER — HYDROCODONE-ACETAMINOPHEN 5-325 MG PO TABS: 1.0000 | ORAL_TABLET | Freq: Four times a day (QID) | ORAL | 0 refills | Status: AC | PRN

## 2020-10-22 MED ORDER — ROCURONIUM BROMIDE 10 MG/ML (PF) SYRINGE
PREFILLED_SYRINGE | INTRAVENOUS | Status: AC
  Filled 2020-10-22: qty 10

## 2020-10-22 MED ORDER — LACTATED RINGERS IV SOLN: INTRAVENOUS | Status: DC

## 2020-10-22 MED ORDER — FENTANYL CITRATE (PF) 250 MCG/5ML IJ SOLN
INTRAMUSCULAR | Status: AC
  Filled 2020-10-22: qty 5

## 2020-10-22 MED ORDER — SUCCINYLCHOLINE CHLORIDE 200 MG/10ML IV SOSY
PREFILLED_SYRINGE | INTRAVENOUS | Status: AC
  Filled 2020-10-22: qty 10

## 2020-10-22 MED ORDER — SODIUM CHLORIDE 0.9 % IR SOLN
Status: DC | PRN
  Administered 2020-10-22: 1000 mL

## 2020-10-22 MED ORDER — GADOBUTROL 1 MMOL/ML IV SOLN
7.0000 mL | Freq: Once | INTRAVENOUS | Status: AC | PRN
  Administered 2020-10-22: 7 mL via INTRAVENOUS

## 2020-10-22 MED ORDER — BUPIVACAINE LIPOSOME 1.3 % IJ SUSP
INTRAMUSCULAR | Status: AC
  Filled 2020-10-22: qty 20

## 2020-10-22 MED ORDER — MEPERIDINE HCL 50 MG/ML IJ SOLN: 6.2500 mg | INTRAMUSCULAR | Status: DC | PRN

## 2020-10-22 SURGICAL SUPPLY — 42 items
APPLICATOR ARISTA FLEXITIP XL (MISCELLANEOUS) IMPLANT
APPLIER CLIP ROT 10 11.4 M/L (STAPLE) ×2
BAG RETRIEVAL 10 (BASKET) ×1
CHLORAPREP W/TINT 26 (MISCELLANEOUS) ×2 IMPLANT
CLIP APPLIE ROT 10 11.4 M/L (STAPLE) ×1 IMPLANT
CLOTH BEACON ORANGE TIMEOUT ST (SAFETY) ×2 IMPLANT
COVER LIGHT HANDLE STERIS (MISCELLANEOUS) ×4 IMPLANT
COVER WAND RF STERILE (DRAPES) ×2 IMPLANT
DERMABOND ADVANCED (GAUZE/BANDAGES/DRESSINGS) ×1
DERMABOND ADVANCED .7 DNX12 (GAUZE/BANDAGES/DRESSINGS) ×1 IMPLANT
ELECT REM PT RETURN 9FT ADLT (ELECTROSURGICAL) ×2
ELECTRODE REM PT RTRN 9FT ADLT (ELECTROSURGICAL) ×1 IMPLANT
GLOVE SURG SS PI 7.5 STRL IVOR (GLOVE) ×2 IMPLANT
GLOVE SURG UNDER POLY LF SZ7 (GLOVE) ×4 IMPLANT
GOWN STRL REUS W/TWL LRG LVL3 (GOWN DISPOSABLE) ×6 IMPLANT
HEMOSTAT ARISTA ABSORB 3G PWDR (HEMOSTASIS) IMPLANT
HEMOSTAT SNOW SURGICEL 2X4 (HEMOSTASIS) ×2 IMPLANT
INST SET LAPROSCOPIC AP (KITS) ×2 IMPLANT
KIT TURNOVER KIT A (KITS) ×2 IMPLANT
MANIFOLD NEPTUNE II (INSTRUMENTS) ×2 IMPLANT
NDL HYPO 18GX1.5 BLUNT FILL (NEEDLE) ×1 IMPLANT
NDL HYPO 21X1.5 SAFETY (NEEDLE) ×1 IMPLANT
NDL INSUFFLATION 14GA 120MM (NEEDLE) ×1 IMPLANT
NEEDLE HYPO 18GX1.5 BLUNT FILL (NEEDLE) ×2 IMPLANT
NEEDLE HYPO 21X1.5 SAFETY (NEEDLE) ×2 IMPLANT
NEEDLE INSUFFLATION 14GA 120MM (NEEDLE) ×2 IMPLANT
NS IRRIG 1000ML POUR BTL (IV SOLUTION) ×2 IMPLANT
PACK LAP CHOLE LZT030E (CUSTOM PROCEDURE TRAY) ×2 IMPLANT
PAD ARMBOARD 7.5X6 YLW CONV (MISCELLANEOUS) ×2 IMPLANT
SET BASIN LINEN APH (SET/KITS/TRAYS/PACK) ×2 IMPLANT
SET TUBE SMOKE EVAC HIGH FLOW (TUBING) ×2 IMPLANT
SLEEVE ENDOPATH XCEL 5M (ENDOMECHANICALS) ×2 IMPLANT
SUT MNCRL AB 4-0 PS2 18 (SUTURE) ×4 IMPLANT
SUT VICRYL 0 UR6 27IN ABS (SUTURE) ×2 IMPLANT
SYR 20ML LL LF (SYRINGE) ×4 IMPLANT
SYS BAG RETRIEVAL 10MM (BASKET) ×1
SYSTEM BAG RETRIEVAL 10MM (BASKET) ×1 IMPLANT
TROCAR ENDO BLADELESS 11MM (ENDOMECHANICALS) ×2 IMPLANT
TROCAR XCEL NON-BLD 5MMX100MML (ENDOMECHANICALS) ×2 IMPLANT
TROCAR XCEL UNIV SLVE 11M 100M (ENDOMECHANICALS) ×2 IMPLANT
TUBE CONNECTING 12X1/4 (SUCTIONS) ×2 IMPLANT
WARMER LAPAROSCOPE (MISCELLANEOUS) ×2 IMPLANT

## 2020-10-22 NOTE — H&P (View-Only) (Signed)
Ultrasound showed cholelithiasis with a common bile duct at 12 mm, though no choledocholithiasis was seen.  An MRCP was performed which revealed no choledocholithiasis.  It is suspected that the patient passed a gallstone.  She now presents for laparoscopic cholecystectomy.  The risks and benefits of the procedure including bleeding, infection, hepatobiliary injury, and the possibility of an open procedure were fully explained to the patient, who gave informed consent.

## 2020-10-22 NOTE — Anesthesia Postprocedure Evaluation (Signed)
Anesthesia Post Note  Patient: Lorraine Thomas  Procedure(s) Performed: LAPAROSCOPIC CHOLECYSTECTOMY (Abdomen)  Patient location during evaluation: PACU Anesthesia Type: General Level of consciousness: awake and alert and oriented Pain management: pain level controlled Vital Signs Assessment: post-procedure vital signs reviewed and stable Respiratory status: spontaneous breathing and respiratory function stable Cardiovascular status: blood pressure returned to baseline and stable Postop Assessment: no apparent nausea or vomiting Anesthetic complications: no   No notable events documented.   Last Vitals:  Vitals:   10/22/20 1445 10/22/20 1459  BP: 117/85 112/81  Pulse: 90 89  Resp: (!) 24 18  Temp:  36.8 C  SpO2: 99% 100%    Last Pain:  Vitals:   10/22/20 1459  TempSrc: Oral  PainSc: 3                  Alizay Bronkema C Pierina Schuknecht

## 2020-10-22 NOTE — Anesthesia Preprocedure Evaluation (Signed)
Anesthesia Evaluation  Patient identified by MRN, date of birth, ID band Patient awake    Reviewed: Allergy & Precautions, NPO status , Patient's Chart, lab work & pertinent test results  History of Anesthesia Complications Negative for: history of anesthetic complications  Airway Mallampati: I  TM Distance: >3 FB Neck ROM: Full    Dental  (+) Dental Advisory Given, Teeth Intact   Pulmonary neg pulmonary ROS,    Pulmonary exam normal breath sounds clear to auscultation       Cardiovascular Exercise Tolerance: Good Normal cardiovascular exam Rhythm:Regular Rate:Normal     Neuro/Psych negative neurological ROS  negative psych ROS   GI/Hepatic negative GI ROS, (+)     substance abuse  marijuana use, Cholelithiasis/cholecystitis    Endo/Other  negative endocrine ROS  Renal/GU negative Renal ROS     Musculoskeletal negative musculoskeletal ROS (+)   Abdominal   Peds  (+) ADHD Hematology negative hematology ROS (+)   Anesthesia Other Findings   Reproductive/Obstetrics negative OB ROS                            Anesthesia Physical Anesthesia Plan  ASA: 2  Anesthesia Plan: General   Post-op Pain Management:    Induction: Intravenous  PONV Risk Score and Plan: 4 or greater and Ondansetron, Dexamethasone, Midazolam and Scopolamine patch - Pre-op  Airway Management Planned: Oral ETT  Additional Equipment:   Intra-op Plan:   Post-operative Plan: Extubation in OR  Informed Consent: I have reviewed the patients History and Physical, chart, labs and discussed the procedure including the risks, benefits and alternatives for the proposed anesthesia with the patient or authorized representative who has indicated his/her understanding and acceptance.     Dental advisory given  Plan Discussed with: CRNA and Surgeon  Anesthesia Plan Comments:         Anesthesia Quick  Evaluation

## 2020-10-22 NOTE — Op Note (Signed)
Patient:  Lorraine Thomas  DOB:  [birth year]  MRN:  [id number]   Preop Diagnosis: Cholecystitis, cholelithiasis  Postop Diagnosis: Same  Procedure: Laparoscopic cholecystectomy  Surgeon: Franky Macho, MD  Assistant: Algis Greenhouse, MD  Anes: General endotracheal  Indications: Patient is a 21 year old white female who presented to the emergency room with worsening right upper quadrant abdominal pain and nausea.  She was noted to have transaminitis.  She ultrasound the gallbladder revealed biliary sludge with a 12 mm common bile duct.  MRCP was negative for choledocholithiasis.  It is felt the patient passed a gallstone.  The patient now presents for laparoscopic cholecystectomy.  The risks and benefits of the procedure including bleeding, infection, hepatobiliary injury, and the possibility of an open procedure were fully explained to the patient, who gave informed consent.  Procedure note: The patient was placed in the supine position.  After induction of general endotracheal anesthesia, the abdomen was prepped and draped using the usual sterile technique with ChloraPrep.  Surgical site confirmation was performed.  A supraumbilical incision was made down to the fascia.  A Veress needle was introduced into the abdominal cavity and confirmation of placement was done using the saline drop test.  The abdomen was then insufflated to 15 mmHg pressure.  An 11 mm trocar was introduced into the abdominal cavity under direct visualization without difficulty.  The patient was placed in reverse Trendelenburg position and an additional 11 mm trocar was placed in the epigastric region and 5 mm trochars were placed the right upper quadrant and right flank regions.  Liver was inspected and noted to be within normal limits.  The gallbladder was noted to be bilobed in nature.  The gallbladder was retracted in a dynamic fashion in order to provide a critical view of the triangle of Calot.  The cystic duct was first  identified.  Its junction to the infundibulum was fully identified.  Endoclips were placed proximally and distally on cystic duct, and the cystic duct was divided.  This was likewise done and the cystic artery.  The gallbladder was freed away from the gallbladder fossa using Bovie electrocautery.  The gallbladder was delivered through the epigastric trocar site using an Endo Catch bag.  The gallbladder fossa was inspected and no abnormal bleeding or bile leakage was noted.  Surgicel was placed in the gallbladder fossa.  All fluid and air were then evacuated from the abdominal cavity prior to removal of the trochars.  All wounds were irrigated with normal saline.  All wounds were injected with Exparel.  The supraumbilical fascia was reapproximated using an 0 Vicryl interrupted suture.  All skin incisions were closed using a 4-0 Monocryl subcuticular suture.  Dermabond was applied.  All tape and needle counts were correct at the end of the procedure.  The patient was extubated in the operating room and transferred to PACU in stable condition.  Complications: None  EBL: Minimal  Specimen: Gallbladder

## 2020-10-22 NOTE — Interval H&P Note (Signed)
History and Physical Interval Note:  10/22/2020 12:23 PM  Lorraine Thomas  has presented today for surgery, with the diagnosis of cholelithiasis.  The various methods of treatment have been discussed with the patient and family. After consideration of risks, benefits and other options for treatment, the patient has consented to  Procedure(s): LAPAROSCOPIC CHOLECYSTECTOMY (N/A) as a surgical intervention.  The patient's history has been reviewed, patient examined, no change in status, stable for surgery.  I have reviewed the patient's chart and labs.  Questions were answered to the patient's satisfaction.     Franky Macho

## 2020-10-22 NOTE — Transfer of Care (Signed)
Immediate Anesthesia Transfer of Care Note  Patient: Lorraine Thomas  Procedure(s) Performed: LAPAROSCOPIC CHOLECYSTECTOMY (Abdomen)  Patient Location: PACU  Anesthesia Type:General  Level of Consciousness: awake  Airway & Oxygen Therapy: Patient Spontanous Breathing and Patient connected to face mask oxygen  Post-op Assessment: Report given to RN and Post -op Vital signs reviewed and stable  Post vital signs: Reviewed and stable  Last Vitals:  Vitals Value Taken Time  BP 125/92 10/22/20 1340  Temp    Pulse 100 10/22/20 1341  Resp 20 10/22/20 1341  SpO2 100 % 10/22/20 1341  Vitals shown include unvalidated device data.  Last Pain:  Vitals:   10/22/20 1227  TempSrc: Oral  PainSc: 3       Patients Stated Pain Goal: 5 (10/22/20 1227)  Complications: No notable events documented.

## 2020-10-22 NOTE — Progress Notes (Signed)
Ultrasound showed cholelithiasis with a common bile duct at 12 mm, though no choledocholithiasis was seen.  An MRCP was performed which revealed no choledocholithiasis.  It is suspected that the patient passed a gallstone.  She now presents for laparoscopic cholecystectomy.  The risks and benefits of the procedure including bleeding, infection, hepatobiliary injury, and the possibility of an open procedure were fully explained to the patient, who gave informed consent. 

## 2020-10-22 NOTE — Anesthesia Procedure Notes (Signed)
Procedure Name: Intubation Date/Time: 10/22/2020 12:36 PM Performed by: Julian Reil, CRNA Pre-anesthesia Checklist: Patient identified, Emergency Drugs available, Suction available and Patient being monitored Patient Re-evaluated:Patient Re-evaluated prior to induction Oxygen Delivery Method: Circle system utilized Preoxygenation: Pre-oxygenation with 100% oxygen Induction Type: IV induction, Rapid sequence and Cricoid Pressure applied Laryngoscope Size: Miller and 3 Grade View: Grade I Tube type: Oral Tube size: 7.0 mm Number of attempts: 1 Airway Equipment and Method: Stylet Placement Confirmation: ETT inserted through vocal cords under direct vision, positive ETCO2 and breath sounds checked- equal and bilateral Secured at: 23 cm Tube secured with: Tape Dental Injury: Teeth and Oropharynx as per pre-operative assessment  Comments: 4x4s bite block used.

## 2020-10-22 NOTE — Discharge Summary (Signed)
Physician Discharge Summary  Patient ID: Lorraine Thomas MRN: [id number] DOB/AGE: Feb 05, 2001 20 y.o.  Admit date: 10/21/2020 Discharge date: 10/22/2020  Admission Diagnoses: Right upper quadrant abdominal pain, transaminitis  Discharge Diagnoses: Same Active Problems:   Transaminitis   Calculus of gallbladder without cholecystitis without obstruction   Discharged Condition: good  Hospital Course: Patient is a 20 year old white female who presented to the emergency room on 10/21/2020 with worsening right upper quadrant abdominal pain and nausea.  She was noted to have transaminitis.  Clinically appeared that she was having a gallbladder attack.  She states she has had episodes like this before but not as severe.  She was placed in the hospital for control of her pain and nausea.  She was started on Rocephin IV.  The following morning, an ultrasound the gallbladder revealed biliary sludge with the dilated common bile duct.  An MRCP was performed which was negative for choledocholithiasis.  The patient subsequently underwent laparoscopic cholecystectomy on 10/22/2020.  She tolerated surgery well.  Her postoperative course was unremarkable.  Her diet was advanced out difficulty.  Patient be discharged home on 10/22/2020 in good and improving condition.  Treatments: surgery: Laparoscopic cholecystectomy on 10/22/2020  Discharge Exam: Blood pressure 112/81, pulse 89, temperature 98.3 F (36.8 C), temperature source Oral, resp. rate 18, height 5\' 1"  (1.549 m), weight 54 kg, SpO2 100 %. General appearance: alert, cooperative, and no distress Resp: clear to auscultation bilaterally Cardio: regular rate and rhythm, S1, S2 normal, no murmur, click, rub or gallop GI: Soft, incisions healing well.  Disposition: Discharge disposition: 01-Home or Self Care       Discharge Instructions     Diet - low sodium heart healthy   Complete by: As directed    Increase activity slowly   Complete by: As  directed       Allergies as of 10/22/2020   No Known Allergies      Medication List     TAKE these medications    Adderall XR 30 MG 24 hr capsule Generic drug: amphetamine-dextroamphetamine TAKE 1 CAPSULE BY MOUTH EVERY MORNING (FILL 09/14/20) What changed: Another medication with the same name was removed. Continue taking this medication, and follow the directions you see here.   amphetamine-dextroamphetamine 10 MG tablet Commonly known as: ADDERALL Take 1 tablet (10 mg total) by mouth daily if needed. What changed: Another medication with the same name was removed. Continue taking this medication, and follow the directions you see here.   Adderall XR 30 MG 24 hr capsule Generic drug: amphetamine-dextroamphetamine Take 1 capsule by mouth once a day (in the morning) What changed: Another medication with the same name was removed. Continue taking this medication, and follow the directions you see here.   HYDROcodone-acetaminophen 5-325 MG tablet Commonly known as: Norco Take 1 tablet by mouth every 6 (six) hours as needed for moderate pain.   hydrOXYzine 10 MG tablet Commonly known as: ATARAX/VISTARIL TAKE 1 TABLET BY MOUTH 4 TIMES DAILY AS NEEDED   sertraline 50 MG tablet Commonly known as: ZOLOFT Take 1 tablet (50 mg total) by mouth daily. What changed: Another medication with the same name was removed. Continue taking this medication, and follow the directions you see here.        Follow-up Information     09/16/20, MD Follow up.   Specialty: General Surgery Why: As needed.  Will call you next week for follow up. Contact information: [address] [place] [phone]  Signed: Franky Macho 10/22/2020, 3:27 PM

## 2020-10-23 ENCOUNTER — Encounter (HOSPITAL_COMMUNITY): Payer: Self-pay | Admitting: General Surgery

## 2020-10-23 LAB — SURGICAL PATHOLOGY

## 2020-11-02 ENCOUNTER — Telehealth (INDEPENDENT_AMBULATORY_CARE_PROVIDER_SITE_OTHER): Payer: 59 | Admitting: General Surgery

## 2020-11-02 DIAGNOSIS — Z09 Encounter for follow-up examination after completed treatment for conditions other than malignant neoplasm: Secondary | ICD-10-CM

## 2020-11-02 NOTE — Telephone Encounter (Signed)
Discussed with in person with patient's stepmom, Aaryn Parrilla, how the patient is doing.  Patient is doing well.  Has already returned back to work.  Has no complaints.  Follow-up as needed.

## 2020-12-03 ENCOUNTER — Other Ambulatory Visit (HOSPITAL_COMMUNITY): Payer: Self-pay

## 2020-12-03 DIAGNOSIS — F902 Attention-deficit hyperactivity disorder, combined type: Secondary | ICD-10-CM | POA: Diagnosis not present

## 2020-12-03 DIAGNOSIS — F419 Anxiety disorder, unspecified: Secondary | ICD-10-CM | POA: Diagnosis not present

## 2020-12-03 DIAGNOSIS — F331 Major depressive disorder, recurrent, moderate: Secondary | ICD-10-CM | POA: Diagnosis not present

## 2020-12-03 MED ORDER — AMPHETAMINE-DEXTROAMPHETAMINE 10 MG PO TABS
ORAL_TABLET | ORAL | 0 refills | Status: DC
  Filled 2020-12-03: qty 30, 30d supply, fill #0

## 2020-12-03 MED ORDER — SERTRALINE HCL 50 MG PO TABS
ORAL_TABLET | ORAL | 0 refills | Status: AC
  Filled 2020-12-03: qty 90, 90d supply, fill #0

## 2020-12-03 MED ORDER — HYDROXYZINE HCL 10 MG PO TABS
ORAL_TABLET | ORAL | 0 refills | Status: AC
  Filled 2020-12-03: qty 120, 30d supply, fill #0

## 2020-12-03 MED ORDER — AMPHETAMINE-DEXTROAMPHET ER 30 MG PO CP24
ORAL_CAPSULE | ORAL | 0 refills | Status: DC
  Filled 2020-12-03: qty 30, 30d supply, fill #0

## 2020-12-04 ENCOUNTER — Other Ambulatory Visit (HOSPITAL_COMMUNITY): Payer: Self-pay

## 2020-12-10 ENCOUNTER — Other Ambulatory Visit (HOSPITAL_COMMUNITY): Payer: Self-pay

## 2021-01-24 ENCOUNTER — Other Ambulatory Visit (HOSPITAL_COMMUNITY): Payer: Self-pay

## 2021-01-24 MED ORDER — AMPHETAMINE-DEXTROAMPHET ER 30 MG PO CP24
ORAL_CAPSULE | ORAL | 0 refills | Status: AC
  Filled 2021-01-24: qty 30, 30d supply, fill #0

## 2021-01-24 MED ORDER — AMPHETAMINE-DEXTROAMPHETAMINE 10 MG PO TABS
ORAL_TABLET | ORAL | 0 refills | Status: AC
  Filled 2021-01-24: qty 30, 30d supply, fill #0

## 2021-05-10 ENCOUNTER — Other Ambulatory Visit (HOSPITAL_COMMUNITY): Payer: Self-pay

## 2021-05-10 DIAGNOSIS — F419 Anxiety disorder, unspecified: Secondary | ICD-10-CM | POA: Diagnosis not present

## 2021-05-10 DIAGNOSIS — F902 Attention-deficit hyperactivity disorder, combined type: Secondary | ICD-10-CM | POA: Diagnosis not present

## 2021-05-10 MED ORDER — AMPHETAMINE-DEXTROAMPHETAMINE 10 MG PO TABS
ORAL_TABLET | ORAL | 0 refills | Status: AC
  Filled 2021-05-10: qty 30, 30d supply, fill #0

## 2021-05-10 MED ORDER — AMPHETAMINE-DEXTROAMPHET ER 30 MG PO CP24
ORAL_CAPSULE | ORAL | 0 refills | Status: AC
  Filled 2021-05-10: qty 30, 30d supply, fill #0

## 2021-05-16 DIAGNOSIS — Z23 Encounter for immunization: Secondary | ICD-10-CM | POA: Diagnosis not present

## 2021-06-29 DIAGNOSIS — F902 Attention-deficit hyperactivity disorder, combined type: Secondary | ICD-10-CM | POA: Diagnosis not present

## 2021-06-29 DIAGNOSIS — Z79899 Other long term (current) drug therapy: Secondary | ICD-10-CM | POA: Diagnosis not present

## 2022-07-01 IMAGING — US US ABDOMEN LIMITED
1 series · 14 of 25 positions shown · non-contrast
Comparison: None.

CLINICAL DATA: 19-year-old female with abdominal pain and elevated
LFTs.

EXAM:
ULTRASOUND ABDOMEN LIMITED RIGHT UPPER QUADRANT

[Series 1: us abdomen limited ruq (liver/gb) · 14 of 43 slices shown]
[im 1/43]
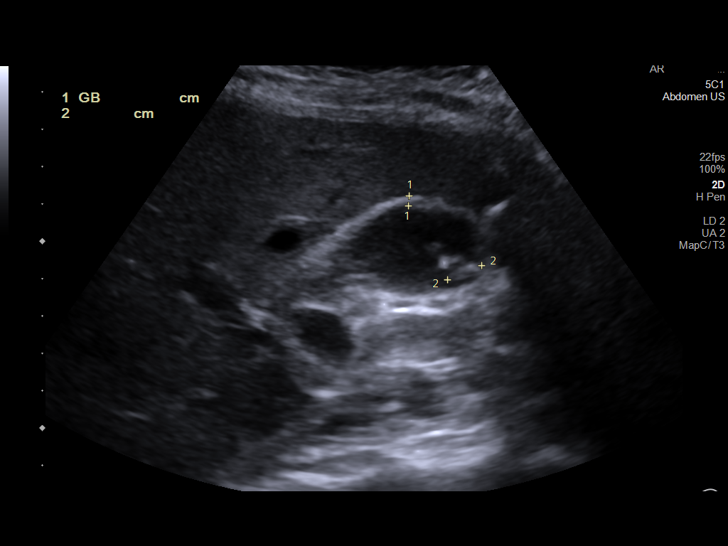
[im 4/43]
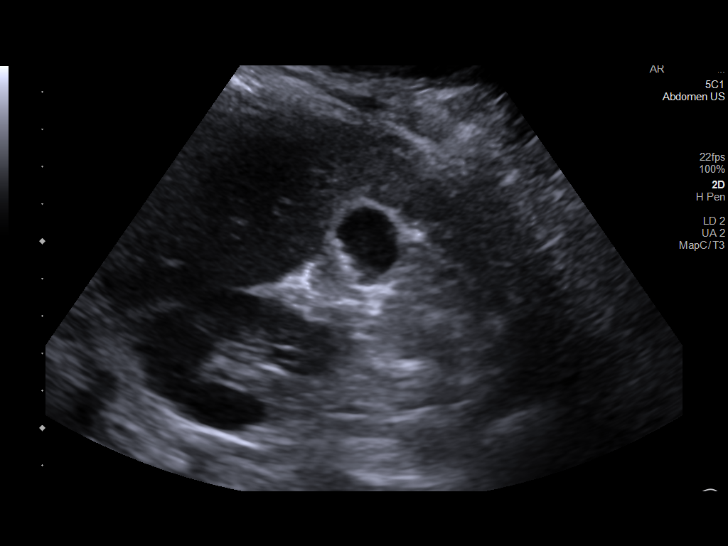
[im 8/43]
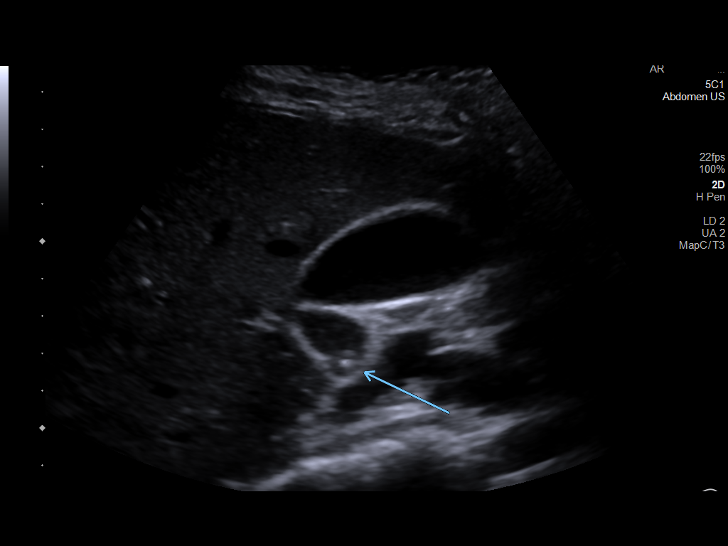
[im 11/43]
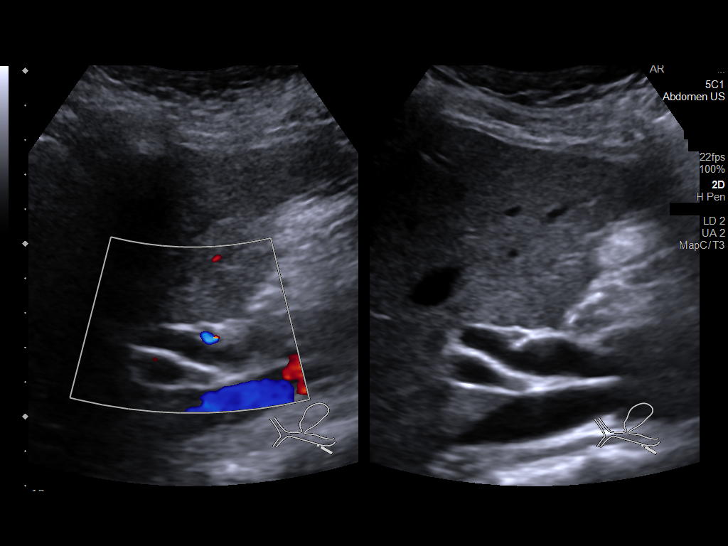
[im 15/43]
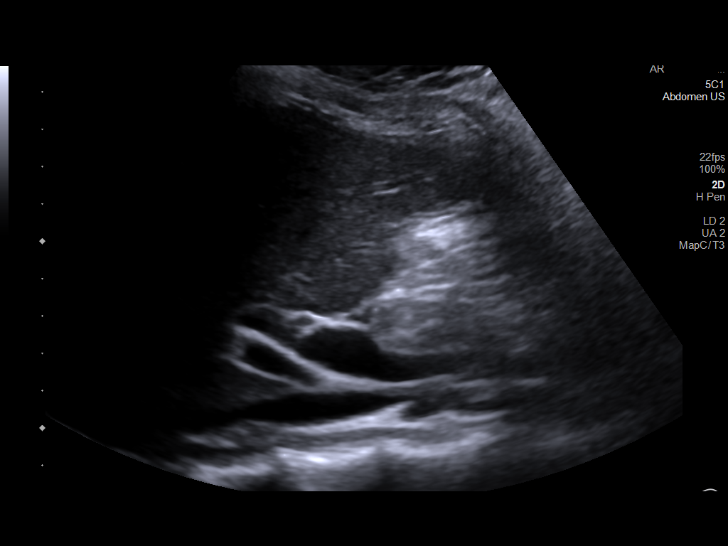
[im 16/43]
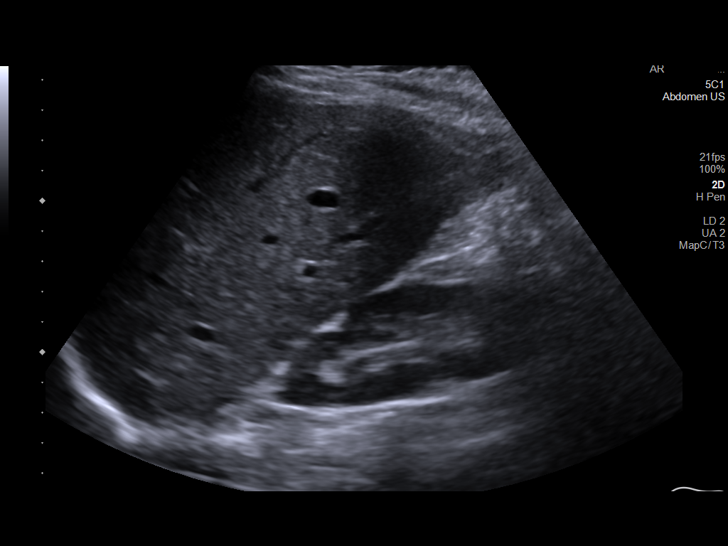
[im 20/43]
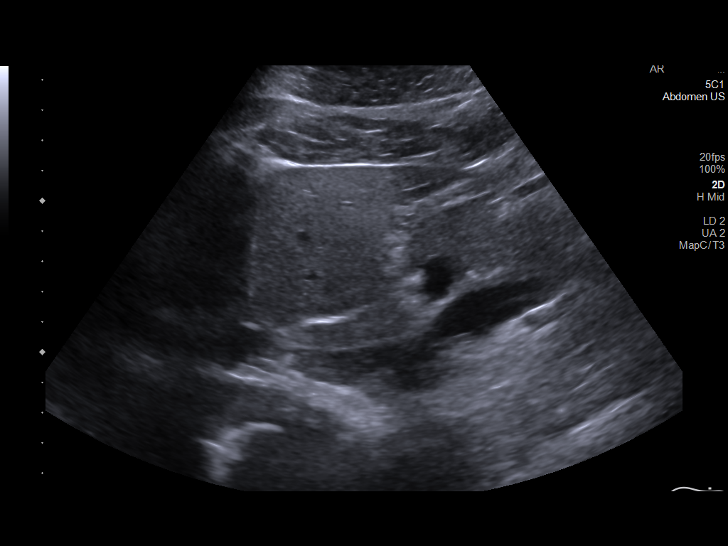
[im 23/43]
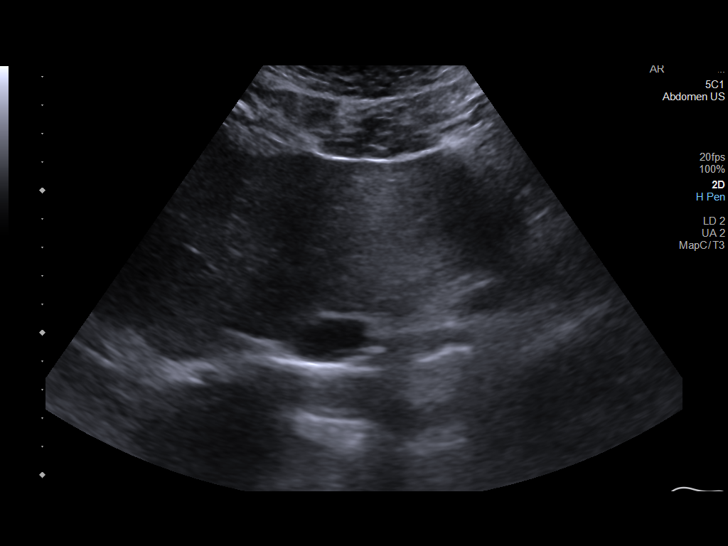
[im 27/43]
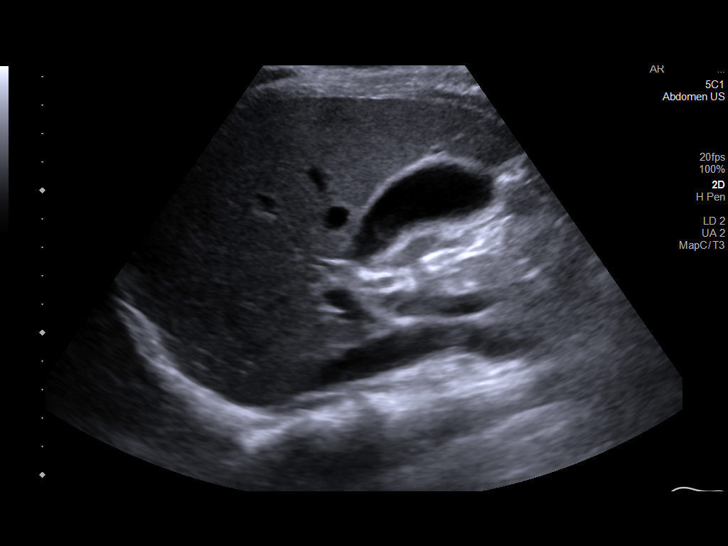
[im 29/43]
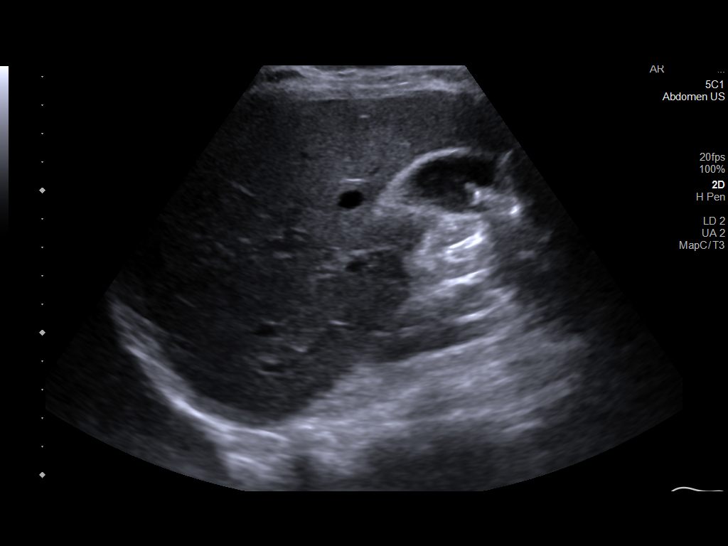
[im 32/43]
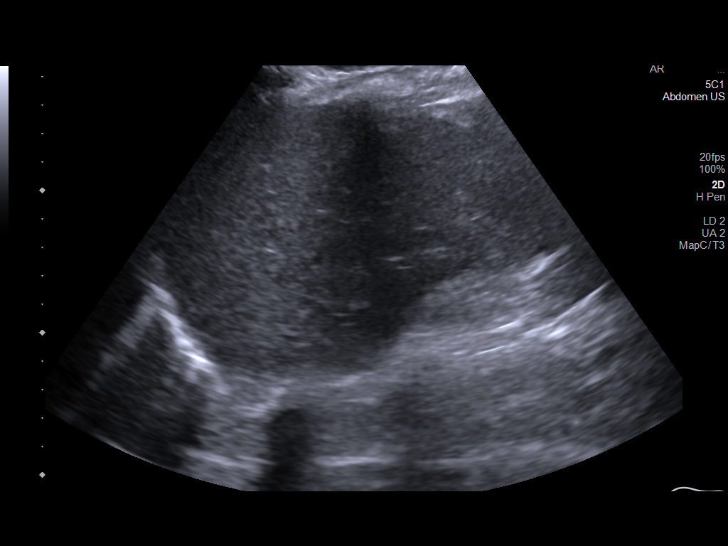
[im 36/43]
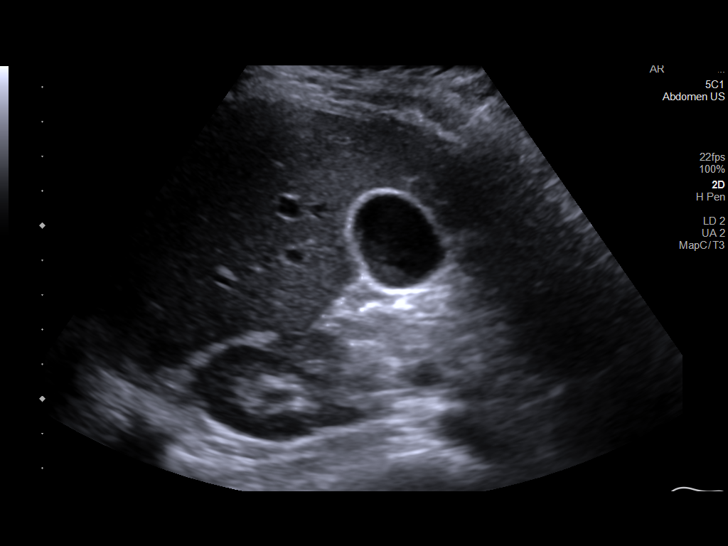
[im 39/43]
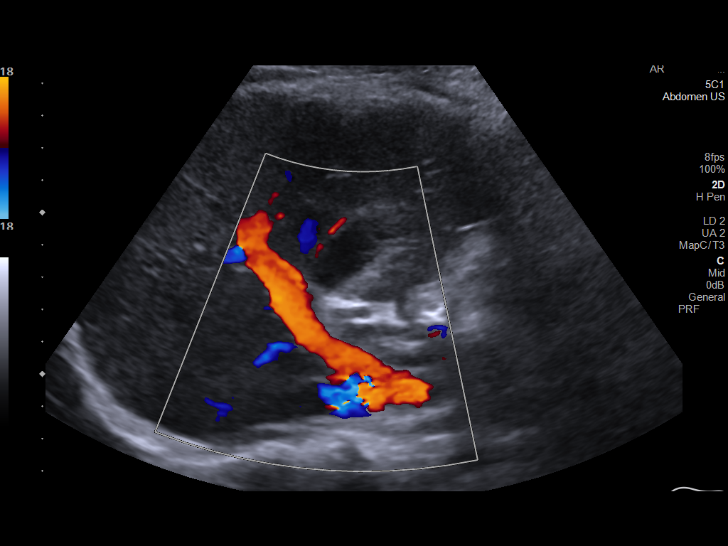
[im 43/43]
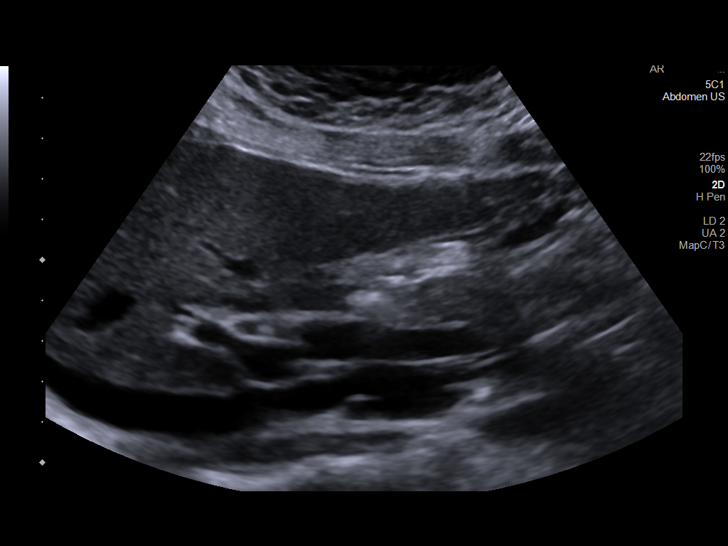

[14 of 25 positions shown; findings below may reference images not displayed]

FINDINGS: Gallbladder:

Mobile gallstones are noted with the largest measuring 1 cm. Small
gallstones are noted at the gallbladder neck. Mild gallbladder wall
thickening is identified. No pericholecystic fluid identified.
Sonographic Murphy sign was difficult to evaluate due to pain
medication.

Common bile duct:

Diameter: 12 mm. No choledocholithiasis identified within the
visualized CBD, but the LOWER CBD is not visualized.

Liver:

No focal lesion identified. Within normal limits in parenchymal
echogenicity. Portal vein is patent on color Doppler imaging with
normal direction of blood flow towards the liver.

Other: None.
IMPRESSION: Cholelithiasis with mild gallbladder wall thickening likely
representing acute cholecystitis.

CBD dilatation. No choledocholithiasis identified within the
visualized CBD, but the LOWER CBD is not visualized.

## 2024-03-28 DIAGNOSIS — J069 Acute upper respiratory infection, unspecified: Secondary | ICD-10-CM | POA: Diagnosis not present
